# Patient Record
Sex: Male | Born: 1985 | Race: Black or African American | Hispanic: No | Marital: Single | Smoking: Current every day smoker
Health system: Southern US, Community
[De-identification: ages and names within clinical notes are randomized; demographics above are authoritative.]

## PROBLEM LIST (undated history)

## (undated) HISTORY — PX: TONSILLECTOMY: SUR1361

## (undated) HISTORY — PX: KNEE SURGERY: SHX244

---
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
## 2007-07-01 ENCOUNTER — Emergency Department (HOSPITAL_COMMUNITY): Admission: EM | Admit: 2007-07-01 | Discharge: 2007-07-01 | Payer: Self-pay | Admitting: Emergency Medicine

## 2008-11-09 ENCOUNTER — Emergency Department (HOSPITAL_COMMUNITY): Admission: EM | Admit: 2008-11-09 | Discharge: 2008-11-09 | Payer: Self-pay | Admitting: Emergency Medicine

## 2010-05-20 ENCOUNTER — Emergency Department (HOSPITAL_COMMUNITY): Admission: EM | Admit: 2010-05-20 | Discharge: 2010-05-20 | Payer: Self-pay | Admitting: Emergency Medicine

## 2011-04-06 NOTE — Op Note (Signed)
NAME:  Reginald Adams, Reginald Adams                          ACCOUNT NO.:  [id number]   MEDICAL RECORD NO.:  [id number]                   PATIENT TYPE:  INP   LOCATION:  A328                                 FACILITY:  [facility]   PHYSICIAN:  J. Darreld Mclean, M.D.              DATE OF BIRTH:  19-Jan-1986   DATE OF PROCEDURE:  04/14/2004  DATE OF DISCHARGE:                                 OPERATIVE REPORT   (CORRECTED COPY)   PREOPERATIVE DIAGNOSIS:  Evulsion tibial tubercle, left knee with disruption  of patella mechanism.   POSTOPERATIVE DIAGNOSIS:  Evulsion tibial tubercle, left knee with  disruption of patella mechanism.   PROCEDURE:  OTIF LEFT tibial tubercle evulsion and repair of patella  mechanism.   ANESTHESIA:  General.   SURGEON:  J. Darreld Mclean, M.D.   ASSISTANT:  Candace Cruise, P.A.-C.   DRAINS:  None.   TOURNIQUET TIME:  36 minutes.   INDICATIONS:  A 25 year old male was playing ball, sustained the above-  mentioned injury tonight.  No other injuries.  He was seen and evaluated,  surgery recommended.  Risks and imponderables were discussed with the  patient's mother and his grandmother.  They appeared to understand and  agreed to the procedure as outlined.   DESCRIPTION OF PROCEDURE:  The patient was identified in the emergency room  area.  Area was marked and also done back in the operating room.  We had a  time out before the procedure began and reidentified the patient and the  left knee.   The patient brought to the operating room, given general anesthetic while on  the ER stretcher.  Once anesthesia was obtained, he was transferred to the  operating room table.  He positioned properly on the table.  Sandbag placed  under his hip.  Tourniquet placed, deflated left upper thigh.  The patient  prepped and draped in the usual manner.  Leg was elevated, wrapped,  circumferentially with Esmarch bandage, tourniquet inflated to 350 mmHg  after general anesthesia had been  induced.  Esmarch bandage removed.  Outline for incision was made over the tibial tubercle area.  Carefully  dissection, and the area was exposed.  Tibial tubercle had been evulsed  proximally, and a lot of the patella mechanism had been brought with it and  then wrapped up underneath of it.  This was then brought forward.  The  fracture site was identified.  It was cleaned of hematoma and irrigated.  Fracture site was then reduced anatomically and held in place with a 0.062  smooth Kirschner wire.  C-arm fluoroscopy unit was used to aid in placement.  Then using a cannulated 4.5 mm cancellous screw, a small drill was placed  and measured 15 mm.  The cancellous screw was then inserted with a washer.  Wire was then removed.  X-rays taken, looked good in AP and lateral views.  Previously inserted 0.062 Kirschner wire  was then cut and bent.  Pictures  were taken.  Fracture was well-reduced.  It was anatomic.  The patella  mechanism where it evulsed off and had actually ripped off at its  insertion was then reapproximated using 0 Surgilon suture.  A very good  repair was obtained.  The wound then reapproximated using 2-0 plain and skin  staples.  Tourniquet deflated after 36 minutes.  Bulky dressing applied.  Knee immobilizer applied.  The patient tolerated the procedure well and will  go to intensive care for reaction.      ___________________________________________                                            Teola Bradley, M.D.   JWK/MEDQ  D:  04/14/2004  T:  04/15/2004  Job:  [id number]

## 2011-04-06 NOTE — Discharge Summary (Signed)
NAME:  Reginald Adams, Reginald Adams                          ACCOUNT NO.:  [id number]   MEDICAL RECORD NO.:  [id number]                   PATIENT TYPE:  INP   LOCATION:  A328                                 FACILITY:  [facility]   PHYSICIAN:  J. Darreld Mclean, M.D.              DATE OF BIRTH:  [birth year]   DATE OF ADMISSION:  04/14/2004  DATE OF DISCHARGE:  04/18/2004                                 DISCHARGE SUMMARY   DISCHARGE DIAGNOSES:  Avulsion of the tibial tubercle and disruption of the  patella mechanism of the knee on the left.   DISCHARGE STATUS:  Improved.   PROGNOSIS:  Good.   DISPOSITION:  Home.   PROCEDURE PERFORMED:  OTIF of the left tibial tubercle and reattachment of  the patella mechanism.   DISCHARGE MEDICATIONS:  Tylox one every four p.r.n. pain.   The patient is to keep his leg dry and elevated.  I will see him in the  office on June 3.  Any difficulty or problem, let me know.   BRIEF HISTORY:  The patient was playing basketball and injured his knee.  He  was seen in the emergency room and taken to the operating room shortly  thereafter.  The seriousness of his injury was explained to his mother and  his grandmother.  He tolerated the procedure well.  The first day he was in  pain.  This was controlled by the PCA.  He began physical therapy.  He was  very slow in his progress with therapy.  He remained afebrile on the second  postoperative day.  We discontinued his IV and had him on p.o. medications.  He still continued to be very slow in therapy.  On the third day he was  afebrile and still having significant pain at times.  At other times he was  not, but he was having difficulty doing steps.  We let him stay another day  where he could do the steps.  He did that successfully and he went home on  the fourth day without any difficulty.  Precautions were given.  He was  walking 200 feet and could do the stairs.   Labs were within normal limits.  He will be seen in the office  as stated;  any difficulties, call me through the office or the hospital beeper.     ___________________________________________                                         Teola Bradley, M.D.   JWK/MEDQ  D:  04/27/2004  T:  04/27/2004  Job:  [id number]

## 2011-08-24 LAB — STREP A DNA PROBE: Group A Strep Probe: NEGATIVE

## 2011-08-24 LAB — RAPID STREP SCREEN (MED CTR MEBANE ONLY): Streptococcus, Group A Screen (Direct): NEGATIVE

## 2011-09-03 LAB — RAPID STREP SCREEN (MED CTR MEBANE ONLY): Streptococcus, Group A Screen (Direct): NEGATIVE

## 2011-09-03 LAB — STREP A DNA PROBE: Group A Strep Probe: NEGATIVE

## 2012-10-18 ENCOUNTER — Emergency Department (HOSPITAL_COMMUNITY)
Admission: EM | Admit: 2012-10-18 | Discharge: 2012-10-18 | Disposition: A | Discharge status: Home / Self Care | Payer: Self-pay | Attending: Emergency Medicine | Admitting: Emergency Medicine

## 2012-10-18 ENCOUNTER — Encounter (HOSPITAL_COMMUNITY): Payer: Self-pay

## 2012-10-18 DIAGNOSIS — R071 Chest pain on breathing: Secondary | ICD-10-CM | POA: Insufficient documentation

## 2012-10-18 DIAGNOSIS — F17200 Nicotine dependence, unspecified, uncomplicated: Secondary | ICD-10-CM | POA: Insufficient documentation

## 2012-10-18 DIAGNOSIS — R0789 Other chest pain: Secondary | ICD-10-CM

## 2012-10-18 DIAGNOSIS — G479 Sleep disorder, unspecified: Secondary | ICD-10-CM | POA: Insufficient documentation
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
## 2013-09-29 ENCOUNTER — Encounter (HOSPITAL_COMMUNITY): Payer: Self-pay | Admitting: Emergency Medicine

## 2013-09-29 ENCOUNTER — Emergency Department (HOSPITAL_COMMUNITY)
Admission: EM | Admit: 2013-09-29 | Discharge: 2013-09-29 | Disposition: A | Discharge status: Home / Self Care | Payer: No Typology Code available for payment source | Attending: Emergency Medicine | Admitting: Emergency Medicine

## 2013-09-29 ENCOUNTER — Emergency Department (HOSPITAL_COMMUNITY): Payer: No Typology Code available for payment source

## 2013-09-29 DIAGNOSIS — S43402A Unspecified sprain of left shoulder joint, initial encounter: Secondary | ICD-10-CM

## 2013-09-29 DIAGNOSIS — Y9389 Activity, other specified: Secondary | ICD-10-CM | POA: Insufficient documentation

## 2013-09-29 DIAGNOSIS — IMO0002 Reserved for concepts with insufficient information to code with codable children: Secondary | ICD-10-CM | POA: Insufficient documentation

## 2013-09-29 DIAGNOSIS — Z880 Allergy status to penicillin: Secondary | ICD-10-CM | POA: Insufficient documentation

## 2013-09-29 DIAGNOSIS — Y92410 Unspecified street and highway as the place of occurrence of the external cause: Secondary | ICD-10-CM | POA: Insufficient documentation

## 2013-09-29 DIAGNOSIS — Z79899 Other long term (current) drug therapy: Secondary | ICD-10-CM | POA: Insufficient documentation

## 2013-09-29 DIAGNOSIS — S139XXA Sprain of joints and ligaments of unspecified parts of neck, initial encounter: Secondary | ICD-10-CM | POA: Insufficient documentation

## 2013-09-29 DIAGNOSIS — F17200 Nicotine dependence, unspecified, uncomplicated: Secondary | ICD-10-CM | POA: Insufficient documentation

## 2013-09-29 MED ORDER — ACETAMINOPHEN-CODEINE 120-12 MG/5ML PO SUSP: 10.0000 mL | Freq: Four times a day (QID) | ORAL | Status: DC | PRN

## 2013-09-29 MED ORDER — CYCLOBENZAPRINE HCL 10 MG PO TABS: 10.0000 mg | ORAL_TABLET | Freq: Three times a day (TID) | ORAL | Status: DC | PRN
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
## 2013-09-30 NOTE — ED Provider Notes (Signed)
Medical screening examination/treatment/procedure(s) were performed by non-physician practitioner and as supervising physician I was immediately available for consultation/collaboration.  EKG Interpretation   None         Joya Gaskins, MD 09/30/13 1511

## 2014-12-30 ENCOUNTER — Emergency Department (HOSPITAL_COMMUNITY): Payer: Self-pay

## 2014-12-30 ENCOUNTER — Encounter (HOSPITAL_COMMUNITY): Payer: Self-pay

## 2014-12-30 ENCOUNTER — Emergency Department (HOSPITAL_COMMUNITY)
Admission: EM | Admit: 2014-12-30 | Discharge: 2014-12-30 | Disposition: A | Discharge status: Home / Self Care | Payer: Self-pay | Attending: Emergency Medicine | Admitting: Emergency Medicine

## 2014-12-30 DIAGNOSIS — Z79899 Other long term (current) drug therapy: Secondary | ICD-10-CM | POA: Insufficient documentation

## 2014-12-30 DIAGNOSIS — Z880 Allergy status to penicillin: Secondary | ICD-10-CM | POA: Insufficient documentation

## 2014-12-30 DIAGNOSIS — S29011A Strain of muscle and tendon of front wall of thorax, initial encounter: Secondary | ICD-10-CM | POA: Insufficient documentation

## 2014-12-30 DIAGNOSIS — Z720 Tobacco use: Secondary | ICD-10-CM | POA: Insufficient documentation

## 2014-12-30 DIAGNOSIS — X58XXXA Exposure to other specified factors, initial encounter: Secondary | ICD-10-CM | POA: Insufficient documentation

## 2014-12-30 DIAGNOSIS — Y998 Other external cause status: Secondary | ICD-10-CM | POA: Insufficient documentation

## 2014-12-30 DIAGNOSIS — Y9389 Activity, other specified: Secondary | ICD-10-CM | POA: Insufficient documentation

## 2014-12-30 DIAGNOSIS — Y9289 Other specified places as the place of occurrence of the external cause: Secondary | ICD-10-CM | POA: Insufficient documentation

## 2014-12-30 DIAGNOSIS — T148XXA Other injury of unspecified body region, initial encounter: Secondary | ICD-10-CM

## 2014-12-30 DIAGNOSIS — R079 Chest pain, unspecified: Secondary | ICD-10-CM

## 2014-12-30 LAB — BASIC METABOLIC PANEL
BUN: 11 mg/dL (ref 6–23)
CO2: 26 mmol/L (ref 19–32)
Calcium: 9.1 mg/dL (ref 8.4–10.5)
Chloride: 109 mmol/L (ref 96–112)
Creatinine, Ser: 0.84 mg/dL (ref 0.50–1.35)
GFR calc Af Amer: >90 mL/min (ref >90)
GFR calc non Af Amer: >90 mL/min (ref >90)
Glucose, Bld: 84 mg/dL (ref 70–99)
Potassium: 4 mmol/L (ref 3.5–5.1)
Sodium: 137 mmol/L (ref 135–145)

## 2014-12-30 LAB — TROPONIN I: Troponin I: <0.03 ng/mL (ref <0.031)

## 2014-12-30 MED ORDER — CYCLOBENZAPRINE HCL 10 MG PO TABS: 10.0000 mg | ORAL_TABLET | Freq: Two times a day (BID) | ORAL | Status: DC | PRN

## 2014-12-30 NOTE — ED Provider Notes (Signed)
CSN: [id number]     Arrival date & time 12/30/14  4098 History   First MD Initiated Contact with Patient 12/30/14 [phone]     Chief Complaint  Patient presents with  . Chest Pain     (Consider location/radiation/quality/duration/timing/severity/associated sxs/prior Treatment) HPI Comments: Pt comes in to be evaluated as work stated that in order for him to continue working he needed to be evaluated. 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 He is having a burning paint that radiates from his left upper chest to his left shoulder. States that the pain is worse with fast movement and lifting. No numbness, weakness, fever, cough, n/v/d, diaphoresis. States that he has history of dislocated left shoulder previously  The history is provided by the patient. No language interpreter was used.    History reviewed. No pertinent past medical history. Past Surgical History  Procedure Laterality Date  . Knee surgery    . Tonsillectomy     No family history on file. History  Substance Use Topics  . Smoking status: Current Every Day Smoker    Types: Cigarettes  . Smokeless tobacco: Not on file  . Alcohol Use: No    Review of Systems  All other systems reviewed and are negative.     Allergies  Morphine and related; Penicillins; and Sulfur  Home Medications   Prior to Admission medications   Medication Sig Start Date End Date Taking? Authorizing Provider  acetaminophen-codeine (CAPITAL/CODEINE) 120-12 MG/5ML suspension Take 10 mLs by mouth every 6 (six) hours as needed for pain. 09/29/13   Tammy L. Triplett, PA-C  citalopram (CELEXA) 20 MG tablet Take 20 mg by mouth daily.    Historical Provider, MD  cyclobenzaprine (FLEXERIL) 10 MG tablet Take 1 tablet (10 mg total) by mouth 3 (three) times daily as needed. 09/29/13   Tammy L. Triplett, PA-C  fexofenadine-pseudoephedrine (ALLEGRA-D 24) 180-240 MG per 24 hr tablet Take 1 tablet by mouth daily as  needed (for allergies/symptoms).    Historical Provider, MD   BP 153/96 mmHg  Pulse 82  Temp(Src) 98.3 F (36.8 C) (Oral)  Resp 18  Ht  (1.88 m)  Wt 260 lb (117.935 kg)  BMI 33.37 kg/m2  SpO2 100% Physical Exam  Constitutional: He is oriented to person, place, and time. He appears well-developed and well-nourished.  Cardiovascular: Normal rate and regular rhythm.   Pulmonary/Chest: Effort normal and breath sounds normal. He exhibits no tenderness.  Abdominal: Soft. Bowel sounds are normal.  Musculoskeletal: Normal range of motion.  Pain with movement of left shoulder  Neurological: He is alert and oriented to person, place, and time.  Skin: Skin is warm and dry.  Psychiatric: He has a normal mood and affect.  Nursing note and vitals reviewed.   ED Course  Procedures (including critical care time) Labs Review Labs Reviewed  TROPONIN I  BASIC METABOLIC PANEL    Imaging Review Dg Chest 2 View  12/30/2014   CLINICAL DATA:  Left-sided chest pain and burning sensation  EXAM: CHEST  2 VIEW  COMPARISON:  May 20, 2010  FINDINGS: The lungs are clear. Heart size and pulmonary vascularity are normal. No adenopathy. No pneumothorax. No bone lesions.  IMPRESSION: No edema or consolidation.   Electronically Signed   By: Bretta Bang III M.D.   On: 12/30/2014 10:37     EKG Interpretation None      MDM   Final diagnoses:  Chest pain  Muscle strain    Doubt cardiac in nature. More likely strain based on job. No sign or pneumothorax. Will treat symptomatically with flexeril    Teressa LowerVrinda Mishelle Hassan, NP 12/30/14 1310  Glynn OctaveStephen Rancour, MD 12/30/14 [phone]

## 2014-12-30 NOTE — Discharge Instructions (Signed)

## 2014-12-30 NOTE — ED Notes (Signed)
Pt alert & oriented x4, stable gait. Patient given discharge instructions, paperwork & prescription(s). Patient  instructed to stop at the registration desk to finish any additional paperwork. Patient verbalized understanding. Pt left department w/ no further questions. 

## 2014-12-30 NOTE — ED Provider Notes (Signed)
Date: 12/30/2014  Rate: 66  Rhythm: normal sinus rhythm  QRS Axis: normal  Intervals: normal  ST/T Wave abnormalities: normal  Conduction Disutrbances:none  Narrative Interpretation: diffuse J point elevation  Old EKG Reviewed: none available    Glynn OctaveStephen Treva Huyett, MD 12/30/14 1008

## 2014-12-30 NOTE — ED Notes (Addendum)
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  Reports was told by his supervisor to come for eval.

## 2015-07-28 ENCOUNTER — Emergency Department (HOSPITAL_COMMUNITY)
Admission: EM | Admit: 2015-07-28 | Discharge: 2015-07-28 | Disposition: A | Discharge status: Home / Self Care | Payer: Self-pay | Attending: Emergency Medicine | Admitting: Emergency Medicine

## 2015-07-28 ENCOUNTER — Encounter (HOSPITAL_COMMUNITY): Payer: Self-pay | Admitting: Emergency Medicine

## 2015-07-28 DIAGNOSIS — Z880 Allergy status to penicillin: Secondary | ICD-10-CM | POA: Insufficient documentation

## 2015-07-28 DIAGNOSIS — R6884 Jaw pain: Secondary | ICD-10-CM | POA: Insufficient documentation

## 2015-07-28 DIAGNOSIS — Z720 Tobacco use: Secondary | ICD-10-CM | POA: Insufficient documentation

## 2015-07-28 MED ORDER — IBUPROFEN 800 MG PO TABS
800.0000 mg | ORAL_TABLET | Freq: Once | ORAL | Status: AC
Start: 2015-07-28 — End: 2015-07-28
  Administered 2015-07-28: 800 mg via ORAL
  Filled 2015-07-28: qty 1

## 2015-07-28 MED ORDER — IBUPROFEN 800 MG PO TABS: 800.0000 mg | ORAL_TABLET | Freq: Three times a day (TID) | ORAL | Status: DC

## 2015-07-28 MED ORDER — CLINDAMYCIN HCL 150 MG PO CAPS: 300.0000 mg | ORAL_CAPSULE | Freq: Three times a day (TID) | ORAL | Status: DC

## 2015-07-28 NOTE — Discharge Instructions (Signed)
Please obtain all of your results from medical records or have your doctors office obtain the results - share them with your doctor - you should be seen at your doctors office in the next 2 days. Call today to arrange your follow up. Take the medications as prescribed. Please review all of the medicines and only take them if you do not have an allergy to them. Please be aware that if you are taking birth control pills, taking other prescriptions, ESPECIALLY ANTIBIOTICS may make the birth control ineffective - if this is the case, either do not engage in sexual activity or use alternative methods of birth control such as condoms until you have finished the medicine and your family doctor says it is OK to restart them. If you are on a blood thinner such as COUMADIN, be aware that any other medicine that you take may cause the coumadin to either work too much, or not enough - you should have your coumadin level rechecked in next 7 days if this is the case.  °?  °It is also a possibility that you have an allergic reaction to any of the medicines that you have been prescribed - Everybody reacts differently to medications and while MOST people have no trouble with most medicines, you may have a reaction such as nausea, vomiting, rash, swelling, shortness of breath. If this is the case, please stop taking the medicine immediately and contact your physician.  °?  °You should return to the ER if you develop severe or worsening symptoms.  ° ° °Emergency Department Resource Guide °1) Find a Doctor and Pay Out of Pocket °Although you won't have to find out who is covered by your insurance plan, it is a good idea to ask around and get recommendations. You will then need to call the office and see if the doctor you have chosen will accept you as a new patient and what types of options they offer for patients who are self-pay. Some doctors offer discounts or will set up payment plans for their patients who do not have insurance,  but you will need to ask so you aren't surprised when you get to your appointment. ° °2) Contact Your Local Health Department °Not all health departments have doctors that can see patients for sick visits, but many do, so it is worth a call to see if yours does. If you don't know where your local health department is, you can check in your phone book. The CDC also has a tool to help you locate your state's health department, and many state websites also have listings of all of their local health departments. ° °3) Find a Walk-in Clinic °If your illness is not likely to be very severe or complicated, you may want to try a walk in clinic. These are popping up all over the country in pharmacies, drugstores, and shopping centers. They're usually staffed by nurse practitioners or physician assistants that have been trained to treat common illnesses and complaints. They're usually fairly quick and inexpensive. However, if you have serious medical issues or chronic medical problems, these are probably not your best option. ° °No Primary Care Doctor: °- Call [facility] at  832-8000 - they can help you locate a primary care doctor that  accepts your insurance, provides certain services, etc. °- Physician Referral Service- 1-800-533-3463 ° °Chronic Pain Problems: °Organization         Address  Phone   Notes  °[facility]  (336)   297-2271 Patients need to be referred by their primary care doctor.  ° °Medication Assistance: °Organization         Address  Phone   Notes  °[place] [facility] [address] °[place], [place] [address] (336) 641-8030 --Must be a resident of [place] °-- Must have NO insurance coverage whatsoever (no Medicaid/ Medicare, etc.) °-- The pt. MUST have a primary care doctor that directs their care regularly and follows them in the community °  °[facility]  (866) 331-1348   °[employer]  (888) 892-1162   ° °Agencies that provide inexpensive  medical care: °Organization         Address  Phone   Notes  °[facility]  (336) 832-8035   °[facility]    (336) 832-7272   °[facility] [address] °[place], [place] [address] (336) 832-4777   °[facility] [address], °[place] (336) 271-4999   °[facility]    (336) 373-0678   °[facility]    (336) 272-1050   °[facility] ° [address], [place] Phone:  (336) 832-4444, Fax:  (336) 832-4440 Hours of Operation:  9 am - 6 pm, M-F.  Also accepts Medicaid/Medicare and self-pay.  °[facility] ° [address], [place] Phone: (336) 832-3150, Fax: (336) 832-3151. Hours of Operation:  8:30 am - 5:30 pm, M-F.  Also accepts Medicaid and self-pay.  °[facility] [place] [address], [place] Phone: (336) 878-6027   °[facility] [address], [place], [place] (336)723-1848, Ext. 123 Mondays & Thursdays: 7-9 AM.  First 15 patients are seen on a first come, first serve basis. °  ° °Medicaid-accepting [place] Providers: ° °Organization         Address  Phone   Notes  °[facility] [address], [place] (336) 641-2100 Also accepts self-pay patients.  °[facility] [address], [place] ° (336) 856-9996   °[facility] [address], [place] (336) 288-8857   °[facility] [address] [place] [address], [place] (336) 299-7000   °Veita Bland [address], [place]  ° (336) 373-1557 Only accepts [place] Access Medicaid patients after they have their name applied to their card.  ° °Self-Pay (no insurance) in [place]: ° °Organization         Address  Phone   Notes  °Sickle Cell Patients, [facility] [address], [place] (336) 832-1970   °[facility] [address], [place] (336) 832-4400   °[facility] ° [address], [place] (336) 992-4800   °[facility]/Dr. Osei-Bonsu ° [address] [place] [address], [place] or [address], [place] (336) 841-8500 Phone number for both [place] and [place] locations is the same.  °[facility] [address], [place] (336) 299-0000   °[facility] [address] [place] [address], [place] or [address] (336) 852-7530 °(336) 878-2260   °[facility] [address],  (336) 350-1642, phone; (336) 294-5005, fax Sees patients 1st and 3rd Saturday of every month.  Must not qualify for public or private insurance (i.e. Medicaid, Medicare, [place] Health Choice, Veterans' Benefits) •   Household income should be no more than 200% of the poverty level •The clinic cannot treat you if you are pregnant or think you are pregnant • Sexually transmitted diseases are not treated at the clinic.  ° ° °Dental Care: °Organization         Address  Phone  Notes  °[facility] [facility] [address], [place] (336) 641-6152 Accepts children up to age 21 who are enrolled in Medicaid or [place] Health Choice; pregnant women with a Medicaid card; and children who have applied for Medicaid or [place] Health Choice, but were declined, whose parents can pay a reduced fee at time of service.  °[facility] [place]  [address], [place] (336) 641-7733 Accepts children up to age 21 who are enrolled in Medicaid or [place] Health Choice; pregnant women with a Medicaid card; and children who have applied for Medicaid or [place] Health Choice, but were declined, whose parents can pay a reduced fee at time of service.  °[facility] ° [address], [place] (336) 641-4533 Patients are seen by appointment only. Walk-ins are not accepted.  [facility] will see patients 18 years of age and older. °Monday - Tuesday (8am-5pm) °Most Wednesdays (8:30-5pm) °$30 per visit, cash only  °[facility] ° [address], [place] (336) 641-4533 Patients are seen by appointment only. Walk-ins are not accepted. [facility] will see patients 18 years of age and older. °One Wednesday Evening (Monthly: Volunteer Based).  $30 per visit, cash only  °[facility]  (919) 537-3737 for adults; Children under age 4, call Graduate Pediatric Dentistry at (919) [phone]. Children aged 4-14, please call (919) 537-3737 to request a pediatric application. ° Dental services are provided in all areas of dental care including fillings, crowns and bridges, complete and partial dentures, implants, gum treatment, root canals, and extractions. Preventive care is also provided. Treatment is provided to both adults and children. °Patients are selected via a lottery and there is often a waiting list. °  °[facility] [address], °[place] ° (336) 763-8833 [email or web address] °  °[facility] [address], [place], [place] (336)723-1848, Ext. 123 Second and Fourth Thursday of each month, opens at 6:30 AM; Clinic ends at 9 AM.  Patients are seen on a first-come first-served basis, and a limited number are seen during each clinic.  ° °[facility] ° [address], [place], [place] (336) 723-7904   Eligibility Requirements °You must have lived in [place], [place], or [place] counties for at least the last three months. °  You cannot be eligible for state or federal sponsored healthcare insurance, including Veterans Administration, Medicaid, or Medicare. °  You generally cannot be eligible for healthcare insurance through your employer.  °  How to apply: °Eligibility screenings are held every Tuesday and Wednesday afternoon from 1:00 pm until 4:00 pm. You do not need an appointment for the  interview!  °[facility] [address], [place], [place] 336-631-2330   °[facility]  336-342-8273   °[facility]  336-703-3100   °[facility]  336-570-6415   ° °Behavioral Health Resources in the Community: °Intensive Outpatient Programs °Organization         Address  Phone  Notes  °[facility] [address], [place],   [place] 336-878-6098   °[facility] [address], [place], [place] 336-832-9800   °[facility] [address], [place], [place] ° 336-882-2125   °[facility] [address],  °[place], [place] 1-800-853-5163 or 336-641-4981   °Substance Abuse Resources °Organization         Address  Phone  Notes  °[facility]  336-882-2125   °[facility]  336-784-9470   °[facility] [place] [facility]  336-285-9073   °[facility]  336-845-3988   °Residential & Outpatient Substance Abuse Program  1-800-659-3381   °Psychological Services °Organization         Address  Phone  Notes  °[facility]  336- 832-9600   °[facility]  336- 378-7881   °[facility] [address], [place] 1-800-853-5163 or 336-641-4981   ° °Mobile Crisis Teams °Organization         Address  Phone  Notes  °[facility], Mobile Crisis Care Unit  1-877-626-1772   °Assertive °[facility] ° [address] [place], [place] 336-834-9664   °Sharon DeEsch [address] °[place] 336-554-5454   ° °Self-Help/Support Groups °Organization         Address  Phone             Notes  °[facility] - variety of support groups  336- 373-1402 Call for more information  °Narcotics Anonymous (NA), [facility] [address], °[place]  2 meetings at this location  ° °Residential Treatment Programs °Organization         Address  Phone  Notes  °[facility]  [address],    °[place]  1-866-801-8205   °[facility] ° [address], [place], [place] 704-293-8524   °[facility] [address], [place] 336-845-3988 Admissions: 8am-3pm M-F  °[facility] [address]    °[place], [place] 336-841-1104   °[facility] [address], [place], [place] 336-379-7146   °[facility] [place] [facility] [address]  °[place], [place] 336-285-9073   °[facility] - Intensive Outpatient [address], [place], [place] 336-852-3033   °[facility] [address]  °[place], [place] 1-877-615-2722 or 336-784-9470   °[facility] [address] [place], [place] 336-227-7417 Accepts Medicaid  °[facility] [address]  °[place] 1-800-659-3381 Substance Abuse/Addiction Treatment  ° °[place] Behavioral Health Resources °Organization         Address  Phone  Notes  °[facility]  (888) 581-9988   °Julie Brannon, PhD [address] [place], [place]   (336) 349-5553 or (336) 951-0000   ° Behavioral   [address] °[place], [place] (336) 349-4454   °[facility] [address], [place], [place] (336) 342-8316 Insurance/Medicaid/sponsorship through [facility]  °[facility] [address]                                    [place], [place] (336) 342-8316 Therapy/tele-psych/case  °[facility] [address]  ° [place], [place] (336) 349-2233    °Dr. Arfeen  (336) 349-4544   °[facility] [place]  [employer] [place] [facility] 1) [address], [place] °2) [address], [place] °3)  [address], [place] (336) 349-3220 °(336) 342-7768 ° °(336) 342-8140   °[place] [facility] (336) 342-1394 or (336) 342-3537 (After Hours)    ° ° ° °

## 2015-07-28 NOTE — ED Provider Notes (Signed)
CSN: [id number]     Arrival date & time 07/28/15  2221 History  This chart was scribed for Eber Hong, MD by Elon Spanner, ED Scribe. This patient was seen in room APA09/APA09 and the patient's care was started at 10:34 PM.   Chief Complaint  Patient presents with  . Dental Problem   The history is provided by the patient. No language interpreter was used.   HPI Comments: Reginald Adams is a 29 y.o. male who presents to the Emergency Department complaining of worsening, constant, moderate, throbbing right-sided dental/facial pain onset yesterday, worse with chewing, not improved with Tylenol.  Associated symptoms include mild right-sided facial swelling and sore throat.  He denies fever, nausea, vomiting. History reviewed. No pertinent past medical history. Past Surgical History  Procedure Laterality Date  . Knee surgery    . Tonsillectomy     History reviewed. No pertinent family history. Social History  Substance Use Topics  . Smoking status: Current Every Day Smoker    Types: Cigarettes  . Smokeless tobacco: None  . Alcohol Use: No    Review of Systems  Constitutional: Negative for fever.  HENT: Positive for dental problem and facial swelling.   Gastrointestinal: Negative for nausea and vomiting.    Allergies  Morphine and related; Penicillins; and Sulfur  Home Medications   Prior to Admission medications   Medication Sig Start Date End Date Taking? Authorizing Provider  acetaminophen-codeine (CAPITAL/CODEINE) 120-12 MG/5ML suspension Take 10 mLs by mouth every 6 (six) hours as needed for pain. Patient not taking: Reported on 12/30/2014 09/29/13   Tammy Triplett, PA-C  clindamycin (CLEOCIN) 150 MG capsule Take 2 capsules (300 mg total) by mouth 3 (three) times daily. May dispense as 150mg  capsules 07/28/15   Eber Hong, MD  cyclobenzaprine (FLEXERIL) 10 MG tablet Take 1 tablet (10 mg total) by mouth 2 (two) times daily as needed for muscle spasms. Patient not taking:  Reported on 07/28/2015 12/30/14   Teressa Lower, NP  ibuprofen (ADVIL,MOTRIN) 800 MG tablet Take 1 tablet (800 mg total) by mouth 3 (three) times daily. 07/28/15   Eber Hong, MD   BP 149/77 mmHg  Pulse 85  Resp 18  SpO2 100% Physical Exam  Constitutional: He appears well-developed and well-nourished.  HENT:  Head: Normocephalic and atraumatic.  No facial assymetry.  No lymphadenopathy of the neck.  No trismus or torticollis.  No malocclusion.  No dental tenderness.  There is tenderness in the right, inferior alveolar recess.  buccal mucosa normal.  duct patent and nontender.  Normal tongue protrusion.  TM's normal.  Eyes: Conjunctivae are normal. Right eye exhibits no discharge. Left eye exhibits no discharge.  Pulmonary/Chest: Effort normal. No respiratory distress.  Neurological: He is alert. Coordination normal.  Skin: Skin is warm and dry. No rash noted. He is not diaphoretic. No erythema.  Psychiatric: He has a normal mood and affect.  Nursing note and vitals reviewed.   ED Course  Procedures (including critical care time)  DIAGNOSTIC STUDIES: Oxygen Saturation is 100% on RA, normal by my interpretation.    COORDINATION OF CARE:  10:43 PM Will order ibuprofen.  Patient acknowledges and agrees with plan.    Labs Review Labs Reviewed - No data to display  Imaging Review No results found. I have personally reviewed and evaluated these images and lab results as part of my medical decision-making.    MDM   Final diagnoses:  Jaw pain   No obvious abscess, ttp over jaw - TMJ  pain - stable - no signs of Ludwigs  I personally performed the services described in this documentation, which was scribed in my presence. The recorded information has been reviewed and is accurate.      Eber Hong, MD 07/28/15 [phone]

## 2015-07-28 NOTE — ED Notes (Signed)
Patient complaining of pain to right side of face. States he is unsure if he is having a dental infection. Also reports facial swelling since yesterday to right side. States feels as if throat is swelling as well.

## 2016-07-10 ENCOUNTER — Emergency Department (HOSPITAL_COMMUNITY)
Admission: EM | Admit: 2016-07-10 | Discharge: 2016-07-10 | Disposition: A | Discharge status: Home / Self Care | Payer: BLUE CROSS/BLUE SHIELD | Attending: Emergency Medicine | Admitting: Emergency Medicine

## 2016-07-10 ENCOUNTER — Encounter (HOSPITAL_COMMUNITY): Payer: Self-pay

## 2016-07-10 ENCOUNTER — Emergency Department (HOSPITAL_COMMUNITY): Payer: BLUE CROSS/BLUE SHIELD

## 2016-07-10 DIAGNOSIS — X509XXA Other and unspecified overexertion or strenuous movements or postures, initial encounter: Secondary | ICD-10-CM | POA: Diagnosis not present

## 2016-07-10 DIAGNOSIS — Y939 Activity, unspecified: Secondary | ICD-10-CM | POA: Insufficient documentation

## 2016-07-10 DIAGNOSIS — Y999 Unspecified external cause status: Secondary | ICD-10-CM | POA: Diagnosis not present

## 2016-07-10 DIAGNOSIS — S8262XA Displaced fracture of lateral malleolus of left fibula, initial encounter for closed fracture: Secondary | ICD-10-CM | POA: Diagnosis not present

## 2016-07-10 DIAGNOSIS — Y929 Unspecified place or not applicable: Secondary | ICD-10-CM | POA: Diagnosis not present

## 2016-07-10 DIAGNOSIS — I10 Essential (primary) hypertension: Secondary | ICD-10-CM | POA: Diagnosis not present

## 2016-07-10 DIAGNOSIS — S82892A Other fracture of left lower leg, initial encounter for closed fracture: Secondary | ICD-10-CM

## 2016-07-10 DIAGNOSIS — F17210 Nicotine dependence, cigarettes, uncomplicated: Secondary | ICD-10-CM | POA: Insufficient documentation

## 2016-07-10 DIAGNOSIS — M25572 Pain in left ankle and joints of left foot: Secondary | ICD-10-CM | POA: Diagnosis present

## 2016-07-10 MED ORDER — IBUPROFEN 800 MG PO TABS: 800.0000 mg | ORAL_TABLET | Freq: Three times a day (TID) | ORAL | 0 refills | Status: DC

## 2016-07-10 MED ORDER — HYDROCODONE-ACETAMINOPHEN 5-325 MG PO TABS: ORAL_TABLET | ORAL | 0 refills | Status: DC

## 2016-07-10 NOTE — ED Triage Notes (Signed)
Pt reports stepped out of his truck into a pot hole in a parking lot and hurt left ankle.  Ankle swollen.

## 2016-07-10 NOTE — Discharge Instructions (Signed)
Wear the boot for weight bearing.  Elevate and apply ice packs on/off Call Dr. Mort SawyersHarrison's office to arrange a follow-up appt.  Also, contact the clinic listed to arrange follow-up regarding your blood pressure

## 2016-07-10 NOTE — ED Notes (Signed)
Pt to Xray.

## 2016-07-10 NOTE — ED Provider Notes (Signed)
[facility] DEPT Provider Note   CSN: [id number] Arrival date & time: 07/10/16  1512  [id number]   History   Chief Complaint Chief Complaint  Patient presents with  . Ankle Pain    HPI Reginald Adams is a 30 y.o. male.  HPI Reginald Adams is a 30 y.o. male who presents to the Emergency Department complaining of left ankle pain and swelling after and twisting injury to his ankle.  Injury occurred several hours prior to arrival.  He states that he continued to work and stand on the affected extremity and pain increased.  He noticed swelling to the lateral ankle after removing his shoe.  He denies numbness of the foot or pain to his toes.  He has not tried any therapies prior to arrival.   History reviewed. No pertinent past medical history.  There are no active problems to display for this patient.   Past Surgical History:  Procedure Laterality Date  . KNEE SURGERY    . TONSILLECTOMY         Home Medications    Prior to Admission medications   Not on File    Family History No family history on file.  Social History Social History  Substance Use Topics  . Smoking status: Current Every Day Smoker    Types: Cigarettes  . Smokeless tobacco: Never Used  . Alcohol use No     Allergies   Morphine and related; Penicillins; and Sulfur   Review of Systems Review of Systems  Constitutional: Negative for chills and fever.  Musculoskeletal: Positive for arthralgias (left ankle pain and swelling) and joint swelling.  Skin: Negative for color change and wound.  All other systems reviewed and are negative.    Physical Exam Updated Vital Signs BP (!) 162/102 (BP Location: Left Arm)   Pulse 92   Temp 98 F (36.7 C) (Temporal)   Resp 16   Ht 6\' 2"  (1.88 m)   Wt 113.4 kg   SpO2 100%   BMI 32.10 kg/m   Physical Exam  Constitutional: He is oriented to person, place, and time. He appears well-developed and well-nourished. No distress.  HENT:  Head: Normocephalic  and atraumatic.  Cardiovascular: Normal rate, regular rhythm and intact distal pulses.   Pulmonary/Chest: Effort normal and breath sounds normal.  Musculoskeletal: He exhibits edema and tenderness. He exhibits no deformity.  Moderate edema and tenderness of lateral left ankle.   DP pulse is brisk,distal sensation intact.  No erythema, abrasion, bruising or bony deformity.  No proximal tenderness.  Neurological: He is alert and oriented to person, place, and time. He exhibits normal muscle tone. Coordination normal.  Skin: Skin is warm and dry.  Nursing note and vitals reviewed.    ED Treatments / Results  Labs (all labs ordered are listed, but only abnormal results are displayed) Labs Reviewed - No data to display  EKG  EKG Interpretation None       Radiology Dg Ankle Complete Left  Result Date: 07/10/2016 CLINICAL DATA:  Twisted ankle. EXAM: LEFT ANKLE COMPLETE - 3+ VIEW COMPARISON:  No recent prior. FINDINGS: Diffuse soft tissue swelling. Avulsion fractures in the mid lateral malleoli. These are slightly displaced. No other focal abnormality identified . IMPRESSION: Slightly displaced fractures of the medial lateral malleoli. Electronically Signed   By: Maisie Fushomas  Register   On: 07/10/2016 15:48    Procedures Procedures (including critical care time)  Medications Ordered in ED Medications - No data to display   Initial Impression /  Assessment and Plan / ED Course  I have reviewed the triage vital signs and the nursing notes.  Pertinent labs & imaging results that were available during my care of the patient were reviewed by me and considered in my medical decision making (see chart for details).  Clinical Course    Discussed XR results with the patient.    Cam walker applied due to patient stating that he needs to continue to work.  Advised to use RICE therapy when possible, orthopedic f/u.  Pt agrees to plan.    Pt is hypertensive.  No known hx. Denies sx's other than  ankle pain.  Counseled pt on importance of further eval and referral info given for clinic.     Final Clinical Impressions(s) / ED Diagnoses   Final diagnoses:  Avulsion fracture of ankle, left, closed, initial encounter  Essential hypertension    New Prescriptions New Prescriptions   No medications on file     Rosey Bathammy Keandra Medero, PA-C 07/11/16 2147    Maia PlanJoshua G Long, MD 07/12/16 1150

## 2017-11-06 DIAGNOSIS — Z139 Encounter for screening, unspecified: Secondary | ICD-10-CM

## 2017-11-06 NOTE — Congregational Nurse Program (Signed)
Congregational Nurse Program Note  Date of Encounter: 11/06/2017  Past Medical History: No past medical history on file.  Encounter Details: CNP Questionnaire - 11/06/17 1558      Questionnaire   Patient Status  Not Applicable    Race  Black or African American    [facility]    Insurance  Not Applicable    Uninsured  Uninsured (NEW 1x/quarter)    Food  No food insecurities    Housing/Utilities  Yes, have permanent housing    Transportation  No transportation needs    Interpersonal Safety  Yes, feel physically and emotionally safe where you currently live    Medication  No medication insecurities    Medical Provider  Yes    Referrals  Primary Care Provider/Clinic    ED Visit Averted  Not Applicable    Life-Saving Intervention Made  Not Applicable       Client came in today for a Flu vaccine. He has a primary care provider in Dr Dwana MelenaZack Hall and was last seen by him on 08/05/17. Client states Dr Margo AyeHall is monitoring his blood pressure for now and he is working on diet changes and has lost weight around 14 pounds and is walking daily.  Client is also working on smoking cessation. He is soon to start a new job at [employer]. He has no further needs at present. Discussed with client regarding to continue healthy diet, decreasing salt intake as well as continue to work on quitting smoking and exercising, Client is to follow up with Dr Margo AyeHall In January 2019. Encouraged client to continue working on healthy choices and to follow Dr. Scharlene GlossHall's recommendations. Client states understanding. Client waited 15 minutes after IM injection of flu vaccine, no reaction noted, no pain no shrtness of breath. Client cleared to leave after his 15 mins. Client read and understood CDC VIC statement prior to signing consent for permission for vaccination. Discussed with client regarding possible pain at injection site and may use cold pack as needed. Client states awareness. RN  discussed with client if he becomes short of breath, itching or rash to call 911. Client states understanding.

## 2018-05-25 IMAGING — DX DG ANKLE COMPLETE 3+V*L*
3 series · 3 of 3 positions shown · non-contrast
Comparison: No recent prior.

CLINICAL DATA: Twisted ankle.

EXAM:
LEFT ANKLE COMPLETE - 3+ VIEW

[ankle ap]
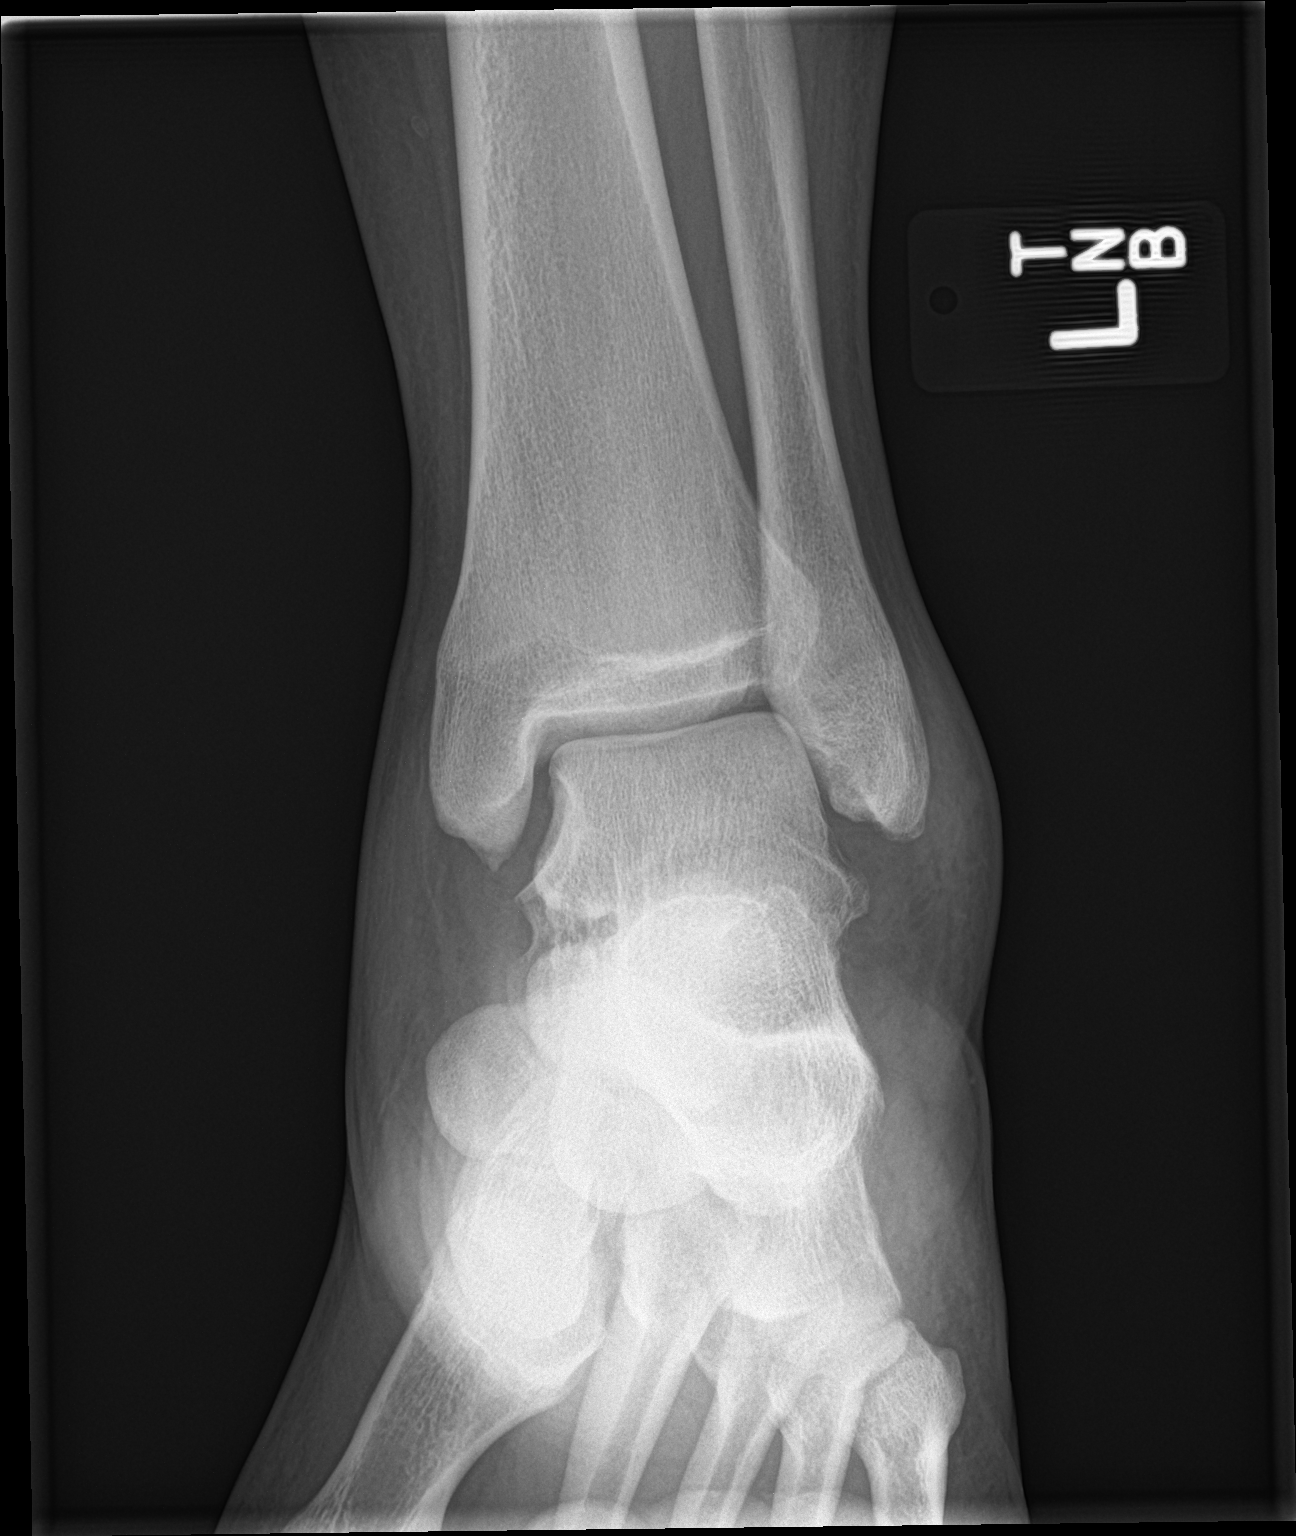

[ankle obl]
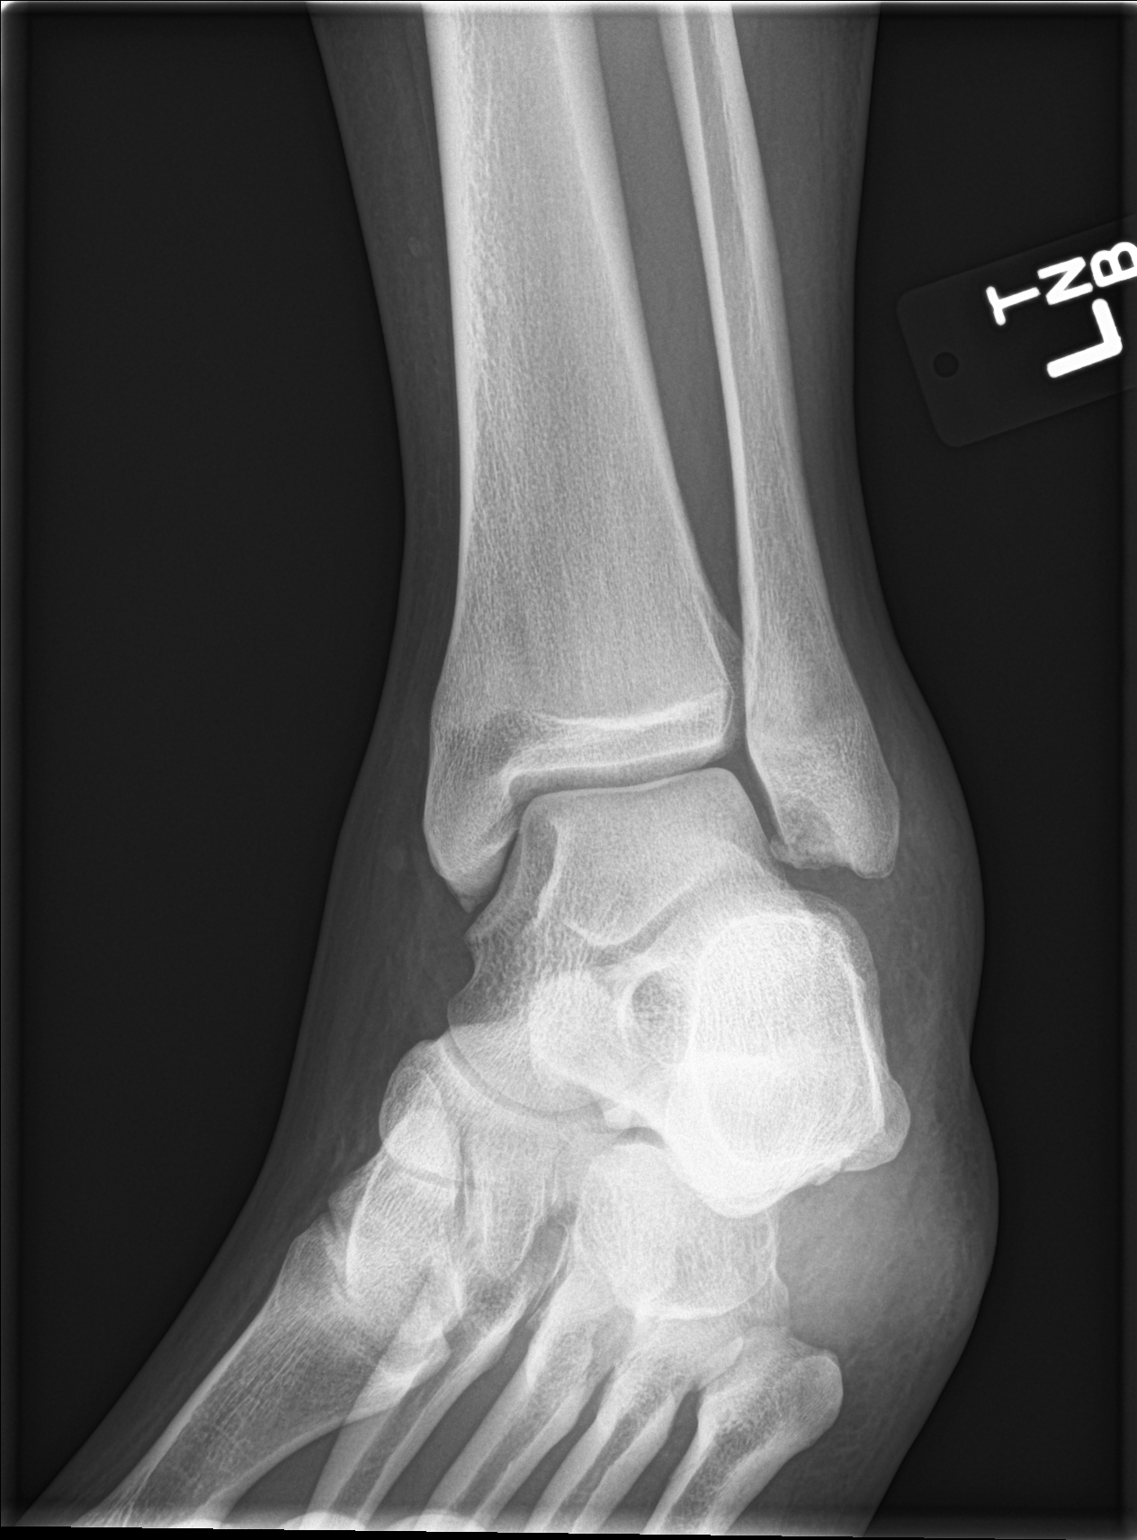

[ankle lat]
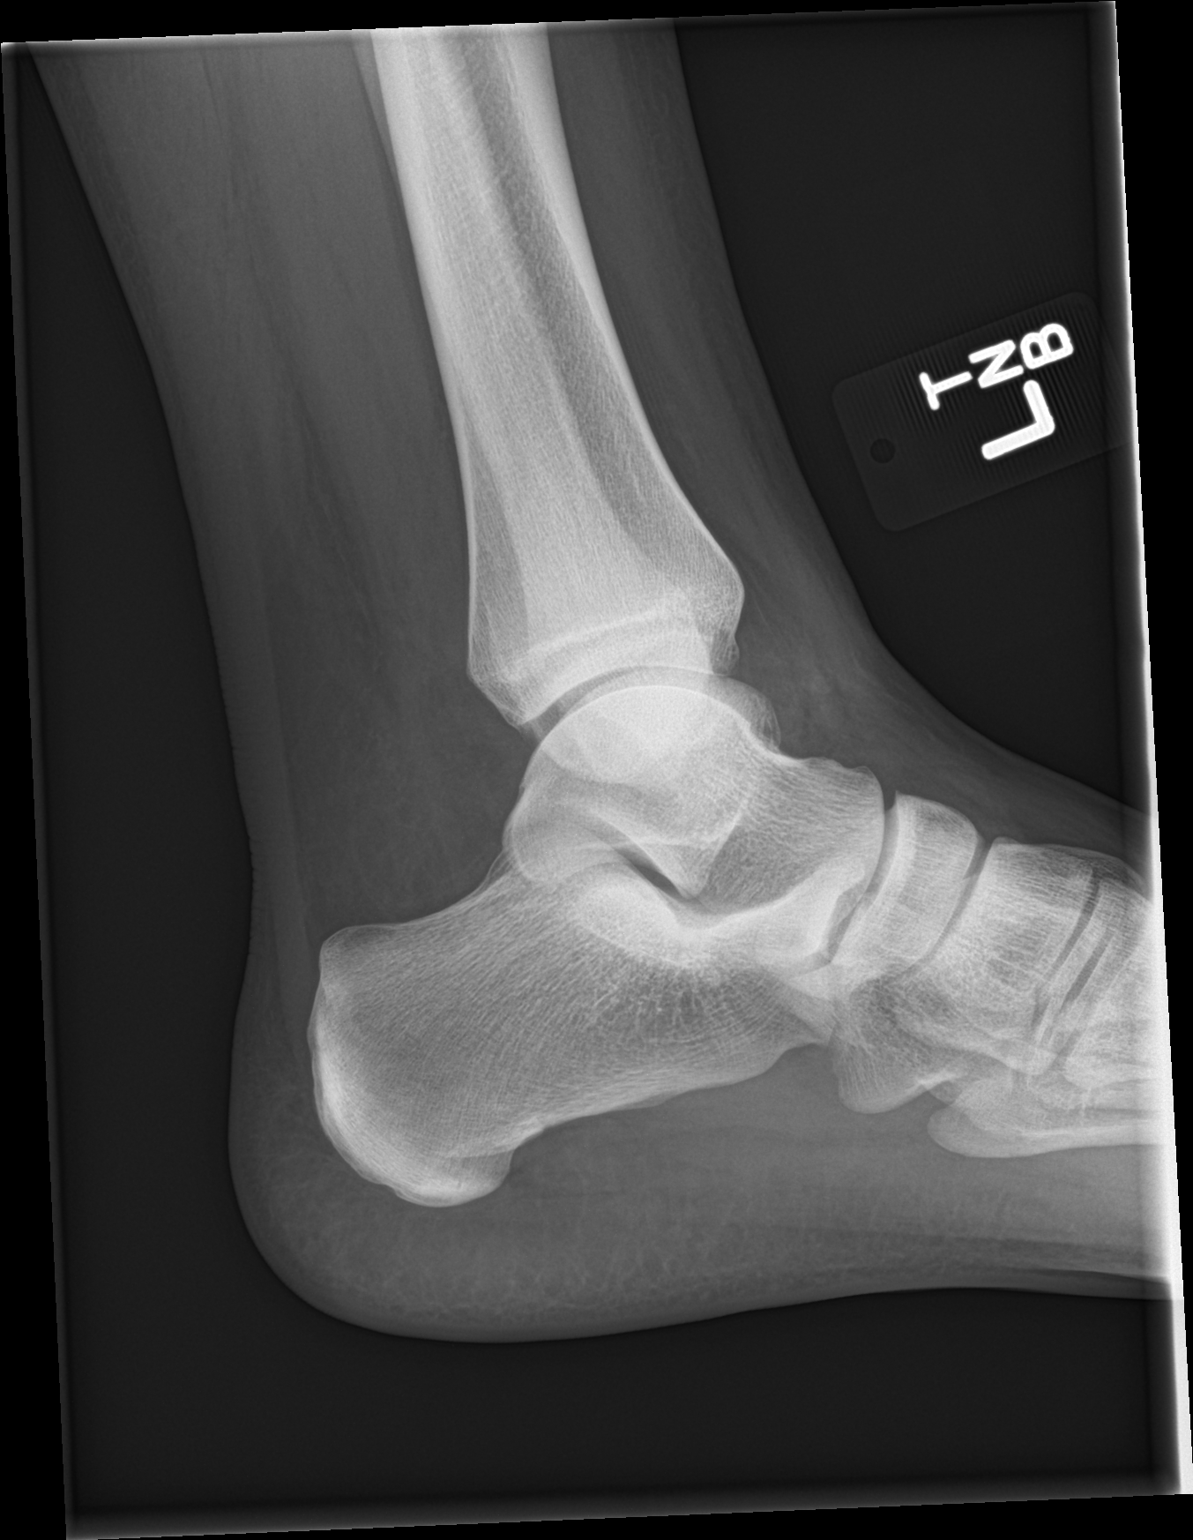

[3 of 3 positions shown; findings below may reference images not displayed]

FINDINGS: Diffuse soft tissue swelling. Avulsion fractures in the mid lateral
malleoli. These are slightly displaced. No other focal abnormality
identified .
IMPRESSION: Slightly displaced fractures of the medial lateral malleoli.

## 2020-02-27 ENCOUNTER — Ambulatory Visit: Payer: BLUE CROSS/BLUE SHIELD

## 2023-05-04 ENCOUNTER — Other Ambulatory Visit: Payer: Self-pay

## 2023-05-04 ENCOUNTER — Emergency Department (HOSPITAL_COMMUNITY): Payer: No Typology Code available for payment source

## 2023-05-04 ENCOUNTER — Encounter (HOSPITAL_COMMUNITY): Payer: Self-pay | Admitting: Emergency Medicine

## 2023-05-04 ENCOUNTER — Emergency Department (HOSPITAL_COMMUNITY)
Admission: EM | Admit: 2023-05-04 | Discharge: 2023-05-04 | Disposition: A | Discharge status: Home / Self Care | Payer: No Typology Code available for payment source | Attending: Emergency Medicine | Admitting: Emergency Medicine

## 2023-05-04 DIAGNOSIS — S8992XA Unspecified injury of left lower leg, initial encounter: Secondary | ICD-10-CM | POA: Diagnosis present

## 2023-05-04 DIAGNOSIS — S8392XA Sprain of unspecified site of left knee, initial encounter: Secondary | ICD-10-CM | POA: Insufficient documentation

## 2023-05-04 DIAGNOSIS — Y92410 Unspecified street and highway as the place of occurrence of the external cause: Secondary | ICD-10-CM | POA: Insufficient documentation
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
